# Patient Record
Sex: Female | Born: 2000 | Race: White | Hispanic: No | Marital: Single | Smoking: Never smoker
Health system: Southern US, Community
[De-identification: ages and names within clinical notes are randomized; demographics above are authoritative.]

## PROBLEM LIST (undated history)

## (undated) DIAGNOSIS — I10 Essential (primary) hypertension: Secondary | ICD-10-CM

## (undated) DIAGNOSIS — M303 Mucocutaneous lymph node syndrome [Kawasaki]: Secondary | ICD-10-CM

## (undated) DIAGNOSIS — R55 Syncope and collapse: Secondary | ICD-10-CM

## (undated) HISTORY — PX: TYMPANOSTOMY TUBE PLACEMENT: SHX32

## (undated) HISTORY — DX: Essential (primary) hypertension: I10

---
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
## 2019-09-28 ENCOUNTER — Other Ambulatory Visit: Payer: Self-pay

## 2019-09-28 ENCOUNTER — Emergency Department (HOSPITAL_COMMUNITY): Payer: Managed Care, Other (non HMO)

## 2019-09-28 ENCOUNTER — Encounter (HOSPITAL_COMMUNITY): Payer: Self-pay

## 2019-09-28 DIAGNOSIS — R42 Dizziness and giddiness: Secondary | ICD-10-CM | POA: Diagnosis not present

## 2019-09-28 DIAGNOSIS — R0602 Shortness of breath: Secondary | ICD-10-CM | POA: Diagnosis not present

## 2019-09-28 DIAGNOSIS — R0789 Other chest pain: Secondary | ICD-10-CM | POA: Diagnosis not present

## 2019-09-28 DIAGNOSIS — R002 Palpitations: Secondary | ICD-10-CM | POA: Insufficient documentation

## 2019-09-28 DIAGNOSIS — R110 Nausea: Secondary | ICD-10-CM | POA: Insufficient documentation

## 2019-09-28 DIAGNOSIS — E876 Hypokalemia: Secondary | ICD-10-CM | POA: Insufficient documentation

## 2019-09-28 DIAGNOSIS — R519 Headache, unspecified: Secondary | ICD-10-CM | POA: Diagnosis not present

## 2019-09-28 MED ORDER — SODIUM CHLORIDE 0.9% FLUSH: 3.0000 mL | Freq: Once | INTRAVENOUS | Status: DC

## 2019-09-28 NOTE — ED Triage Notes (Signed)
Pt arrived with complaints of chest pressure/pain that started this afternoon which radiates up her left neck. Pt states she is short of breath, dizzy, and nauseated.

## 2019-09-28 NOTE — ED Notes (Addendum)
Pt called for lab draw x1, no answer. [employer] ADDENDUM: Pt located in triage 1, labs currently being drawn for analysis. [employer]

## 2019-09-29 ENCOUNTER — Emergency Department (HOSPITAL_COMMUNITY)
Admission: EM | Admit: 2019-09-29 | Discharge: 2019-09-29 | Disposition: A | Discharge status: Home / Self Care | Payer: Managed Care, Other (non HMO) | Attending: Emergency Medicine | Admitting: Emergency Medicine

## 2019-09-29 DIAGNOSIS — R519 Headache, unspecified: Secondary | ICD-10-CM

## 2019-09-29 DIAGNOSIS — E876 Hypokalemia: Secondary | ICD-10-CM

## 2019-09-29 DIAGNOSIS — R002 Palpitations: Secondary | ICD-10-CM

## 2019-09-29 DIAGNOSIS — R0789 Other chest pain: Secondary | ICD-10-CM

## 2019-09-29 LAB — CBC
HCT: 40.3 % (ref 36.0–46.0)
Hemoglobin: 13.6 g/dL (ref 12.0–15.0)
MCH: 31.7 pg (ref 26.0–34.0)
MCHC: 33.7 g/dL (ref 30.0–36.0)
MCV: 93.9 fL (ref 80.0–100.0)
Platelets: 292 10*3/uL (ref 150–400)
RBC: 4.29 MIL/uL (ref 3.87–5.11)
RDW: 11.9 % (ref 11.5–15.5)
WBC: 7.6 10*3/uL (ref 4.0–10.5)
nRBC: 0 % (ref 0.0–0.2)

## 2019-09-29 LAB — I-STAT BETA HCG BLOOD, ED (NOT ORDERABLE): I-stat hCG, quantitative: <5 m[IU]/mL (ref <5)

## 2019-09-29 LAB — BASIC METABOLIC PANEL
Anion gap: 9 (ref 5–15)
BUN: 11 mg/dL (ref 6–20)
CO2: 24 mmol/L (ref 22–32)
Calcium: 9.1 mg/dL (ref 8.9–10.3)
Chloride: 107 mmol/L (ref 98–111)
Creatinine, Ser: 0.66 mg/dL (ref 0.44–1.00)
GFR calc Af Amer: >60 mL/min (ref >60)
GFR calc non Af Amer: >60 mL/min (ref >60)
Glucose, Bld: 99 mg/dL (ref 70–99)
Potassium: 3.2 mmol/L — ABNORMAL LOW (ref 3.5–5.1)
Sodium: 140 mmol/L (ref 135–145)

## 2019-09-29 LAB — TROPONIN I (HIGH SENSITIVITY)
Troponin I (High Sensitivity): <2 ng/L (ref <18)
Troponin I (High Sensitivity): <2 ng/L (ref <18)

## 2019-09-29 MED ORDER — POTASSIUM CHLORIDE CRYS ER 20 MEQ PO TBCR
40.0000 meq | EXTENDED_RELEASE_TABLET | Freq: Once | ORAL | Status: AC
  Administered 2019-09-29: 40 meq via ORAL
  Filled 2019-09-29: qty 2

## 2019-09-29 NOTE — Discharge Instructions (Signed)
Your work-up today was reassuring.  Your potassium was a little bit low so we will get you some orally to help bring your potassium levels back to normal.  Drink when you are get plenty of rest.  Continue to take your medications as prescribed.  Call your cardiologist in the morning to set up follow-up appointment for reevaluation of your symptoms.  Return to the emergency department if any concerning signs or symptoms develop such as worsening pain, shortness of breath, high fevers, loss of consciousness.

## 2019-09-29 NOTE — ED Provider Notes (Signed)
[facility]-EMERGENCY DEPT Provider Note   CSN: [id number] Arrival date & time: 09/28/19  2146     History   Chief Complaint Chief Complaint  Patient presents with   Chest Pain    HPI Jacqueline Kirby is a 18 y.o. female with history of Kawasaki disease at 18 years of age and vasovagal syncope presents today for evaluation of acute onset, intermittent chest pain.  She reports that 4 days ago while washing her face she developed a very sharp substernal chest pain radiating to the left side at that lasted for a few minutes before resolving entirely.  At noon yesterday morning (09/28/2019) while sitting at her desk she reports developing substernal chest pressure with associated nausea, shortness of breath, lightheadedness.  The pain will occasionally radiate to the left upper extremity and into the left side of the neck.  No aggravating or alleviating factors noted.  The pain is not exertional or pleuritic in nature.  Denies abdominal pain, syncope, fevers, chills, or cough.  Has not tried anything for her symptoms.  No family history of heart disease at a young age.  She denies cigarette smoking, recreational drug use, or excessive alcohol intake.  Presently she denies very mild chest pressure radiating into the left side of the neck.  She also reports developing mild generalized throbbing headache that is similar to headaches she has had in the past.  No vision changes.  [facility] due to her history of vasovagal syncope and takes Midodrine for this.  Chart review shows that she recently followed up with her cardiologist on 08/31/2019 and had her dose increased from 3 times daily to 4 times daily.     The history is provided by the patient.    Past Medical History:  Diagnosis Date   Kawasaki disease (HCC)    Vasovagal syncope     There are no active problems to display for this patient.   Past Surgical History:  Procedure Laterality Date    TYMPANOSTOMY TUBE PLACEMENT       OB History   No obstetric history on file.      Home Medications    Prior to Admission medications   Medication Sig Start Date End Date Taking? Authorizing Provider  midodrine (PROAMATINE) 2.5 MG tablet Take 2.5 mg by mouth 4 (four) times daily.  09/22/19  Yes [provider]  SRONYX 0.1-20 MG-MCG tablet Take 1 tablet by mouth daily. 09/09/19  Yes [provider]  cetirizine (ZYRTEC) 10 MG tablet Take 10 mg by mouth daily.     [provider]  ondansetron (ZOFRAN ODT) 4 MG disintegrating tablet 4mg  ODT q4 hours prn nausea/vomit Patient not taking: Reported on 09/29/2019 09/21/18   Charlynne PanderYao, David Hsienta, MD    Family History No family history on file.  Social History Social History   Tobacco Use   Smoking status: Never Smoker   Smokeless tobacco: Never Used  Substance Use Topics   Alcohol use: Never    Frequency: Never   Drug use: Never     Allergies   Patient has no known allergies.   Review of Systems Review of Systems  Constitutional: Negative for chills, diaphoresis and fever.  Eyes: Negative for photophobia and visual disturbance.  Respiratory: Positive for shortness of breath.   Cardiovascular: Positive for chest pain.  Gastrointestinal: Negative for abdominal pain, nausea and vomiting.  Neurological: Positive for light-headedness and headaches. Negative for syncope.  All other systems reviewed and  are negative.    Physical Exam Updated Vital Signs BP 104/65    Pulse 69    Temp 98.4 F (36.9 C) (Oral)    Resp 15    Ht 5\' 4"  (1.626 m)    Wt 58.1 kg    LMP 09/25/2019    SpO2 99%    BMI 21.97 kg/m   Physical Exam Vitals signs and nursing note reviewed.  Constitutional:      General: She is not in acute distress.    Appearance: She is well-developed.     Comments: Resting comfortably in bed  HENT:     Head: Normocephalic and atraumatic.  Eyes:     General:        Right eye: No discharge.          Left eye: No discharge.     Extraocular Movements: Extraocular movements intact.     Conjunctiva/sclera: Conjunctivae normal.     Pupils: Pupils are equal, round, and reactive to light.  Neck:     Musculoskeletal: Normal range of motion and neck supple.     Vascular: No JVD.     Trachea: No tracheal deviation.  Cardiovascular:     Rate and Rhythm: Normal rate and regular rhythm.     Heart sounds: Normal heart sounds.  Pulmonary:     Effort: Pulmonary effort is normal. No tachypnea.     Comments: Speaking in full sentences without difficulty. Chest:     Chest wall: No tenderness.  Abdominal:     General: There is no distension.     Palpations: Abdomen is soft. There is no mass.     Tenderness: There is no abdominal tenderness. There is no guarding or rebound.  Musculoskeletal:     Right lower leg: She exhibits no tenderness. No edema.     Left lower leg: She exhibits no tenderness. No edema.  Skin:    General: Skin is warm and dry.     Findings: No erythema.  Neurological:     General: No focal deficit present.     Mental Status: She is alert.     Cranial Nerves: No cranial nerve deficit.     Motor: No weakness.  Psychiatric:        Behavior: Behavior normal.      ED Treatments / Results  Labs (all labs ordered are listed, but only abnormal results are displayed) Labs Reviewed  BASIC METABOLIC PANEL - Abnormal; Notable for the following components:      Result Value   Potassium 3.2 (*)    All other components within normal limits  CBC  I-STAT BETA HCG BLOOD, ED ([facility], [facility], [facility] ONLY)  I-STAT BETA HCG BLOOD, ED (NOT ORDERABLE)  TROPONIN I (HIGH SENSITIVITY)  TROPONIN I (HIGH SENSITIVITY)    EKG EKG Interpretation  Date/Time:  Tuesday September 28 2019 22:49:42 EDT Ventricular Rate:  87 PR Interval:    QRS Duration: 85 QT Interval:  340 QTC Calculation: 409 R Axis:   76 Text Interpretation:  Sinus rhythm No old tracing to compare Confirmed by 06-08-2005  [phone]) on 09/29/2019 3:46:46 AM   Radiology Dg Chest 2 View  Result Date: 09/28/2019 CLINICAL DATA:  Shortness of breath and chest pain EXAM: CHEST - 2 VIEW COMPARISON:  None. FINDINGS: The heart size and mediastinal contours are within normal limits. Both lungs are clear. The visualized skeletal structures are unremarkable. IMPRESSION: No active cardiopulmonary disease. Electronically Signed   By: 11/28/2019.D.  On: 09/28/2019 23:07     Procedures Procedures (including critical care time)  Medications Ordered in ED Medications  potassium chloride SA (KLOR-CON) CR tablet 40 mEq (40 mEq Oral Given 09/29/19 0519)     Initial Impression / Assessment and Plan / ED Course  I have reviewed the triage vital signs and the nursing notes.  Pertinent labs & imaging results that were available during my care of the patient were reviewed by me and considered in my medical decision making (see chart for details).        Patient presents for evaluation of intermittent chest pain.  Also developed a very mild generalized headache while in the waiting room but reports that doxepin she is actually concerned about is not what brought her to the emergency department.  She has a normal neurologic hemorrhage, CVA, meningitis, or other concerning acute intracranial abnormality.  She is afebrile, vital signs are stable.  She is nontoxic in appearance.  Chest pain is not reproducible on palpation, pleuritic, or exertional.  On examination she exhibits regular rate and rhythm and EKG shows normal sinus rhythm with no ischemic abnormalities noted.  Serial troponins were obtained in the ED which were reassuring and not suggestive of ACS/MI.  Chest x-ray shows no acute cardiopulmonary abnormalities with no evidence of pneumonia or pleural effusion.  Remainder of lab work reviewed by me shows no leukocytosis, no anemia, no metabolic derangements.  No renal insufficiency.  She was mildly hypokalemic which was  replenished orally in the ED.  No concern for PE, cardiac tamponade, esophageal rupture, or pneumothorax.  Considered dissection however in the setting of normal blood pressure was normal neurologic examination I have a low suspicion of this.  She does have an outpatient cardiologist with whom she can follow up closely.  No further emergent work-up required in the ED.  On reevaluation the patient is resting comfortably in no apparent distress.  She is comfortable with discharge home.  She will call her cardiologist first thing in the morning to set up follow-up.  Discussed strict ED return precautions. Patient verbalized understanding of and agreement with plan and is safe for discharge home at this time.  Discussed with Dr. Leonette Monarch who agrees with assessment and plan at this time.  Final Clinical Impressions(s) / ED Diagnoses   Final diagnoses:  Atypical chest pain  Mild headache  Palpitations  Hypokalemia    ED Discharge Orders    None       Renita Papa, PA-C 10/01/19 1443    Fatima Blank, MD 10/02/19 [phone]

## 2019-12-13 IMAGING — CR DG CHEST 2V
2 series · 2 of 2 positions shown · non-contrast
Comparison: None.

CLINICAL DATA: Shortness of breath and chest pain

EXAM:
CHEST - 2 VIEW

[w chest pa]
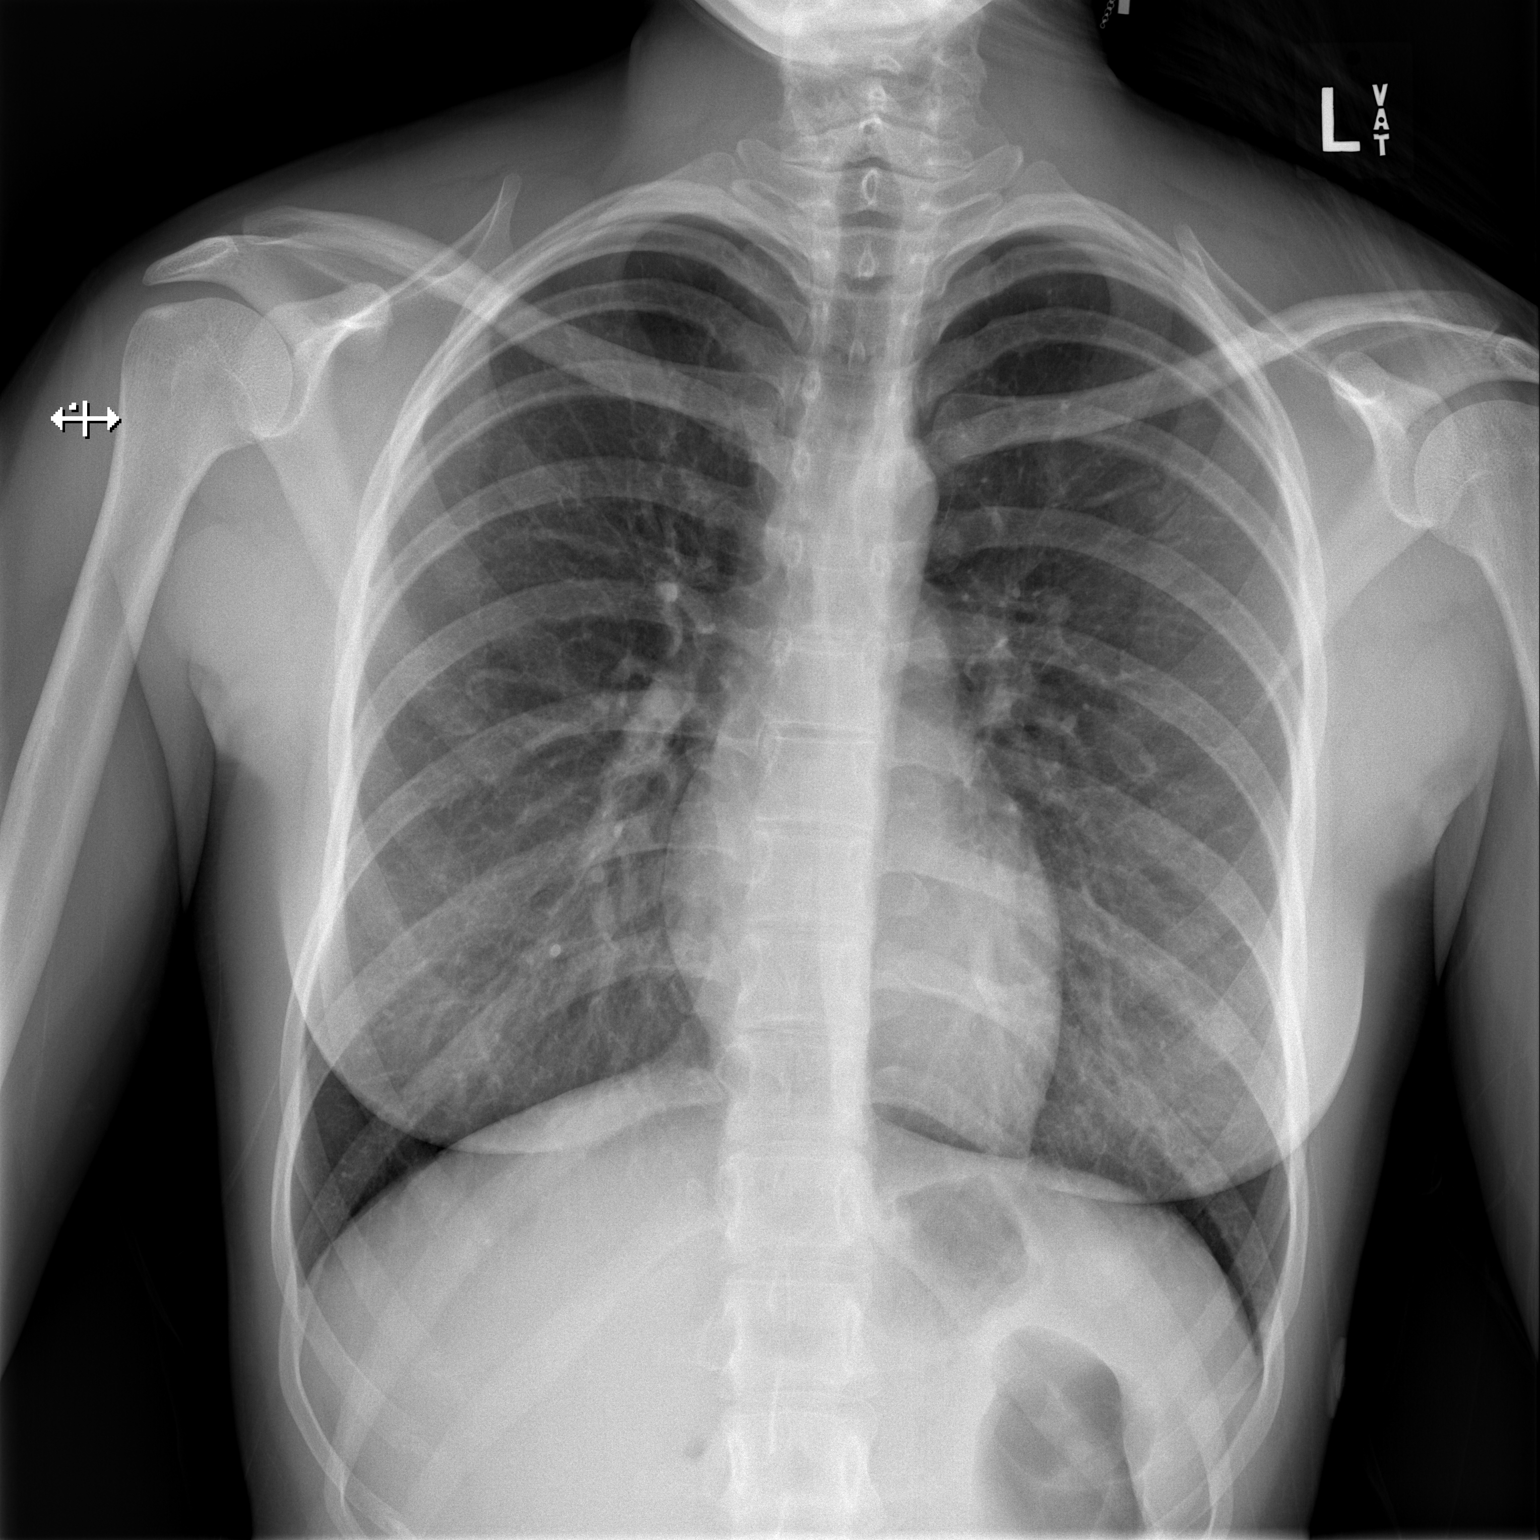

[w chest lat]
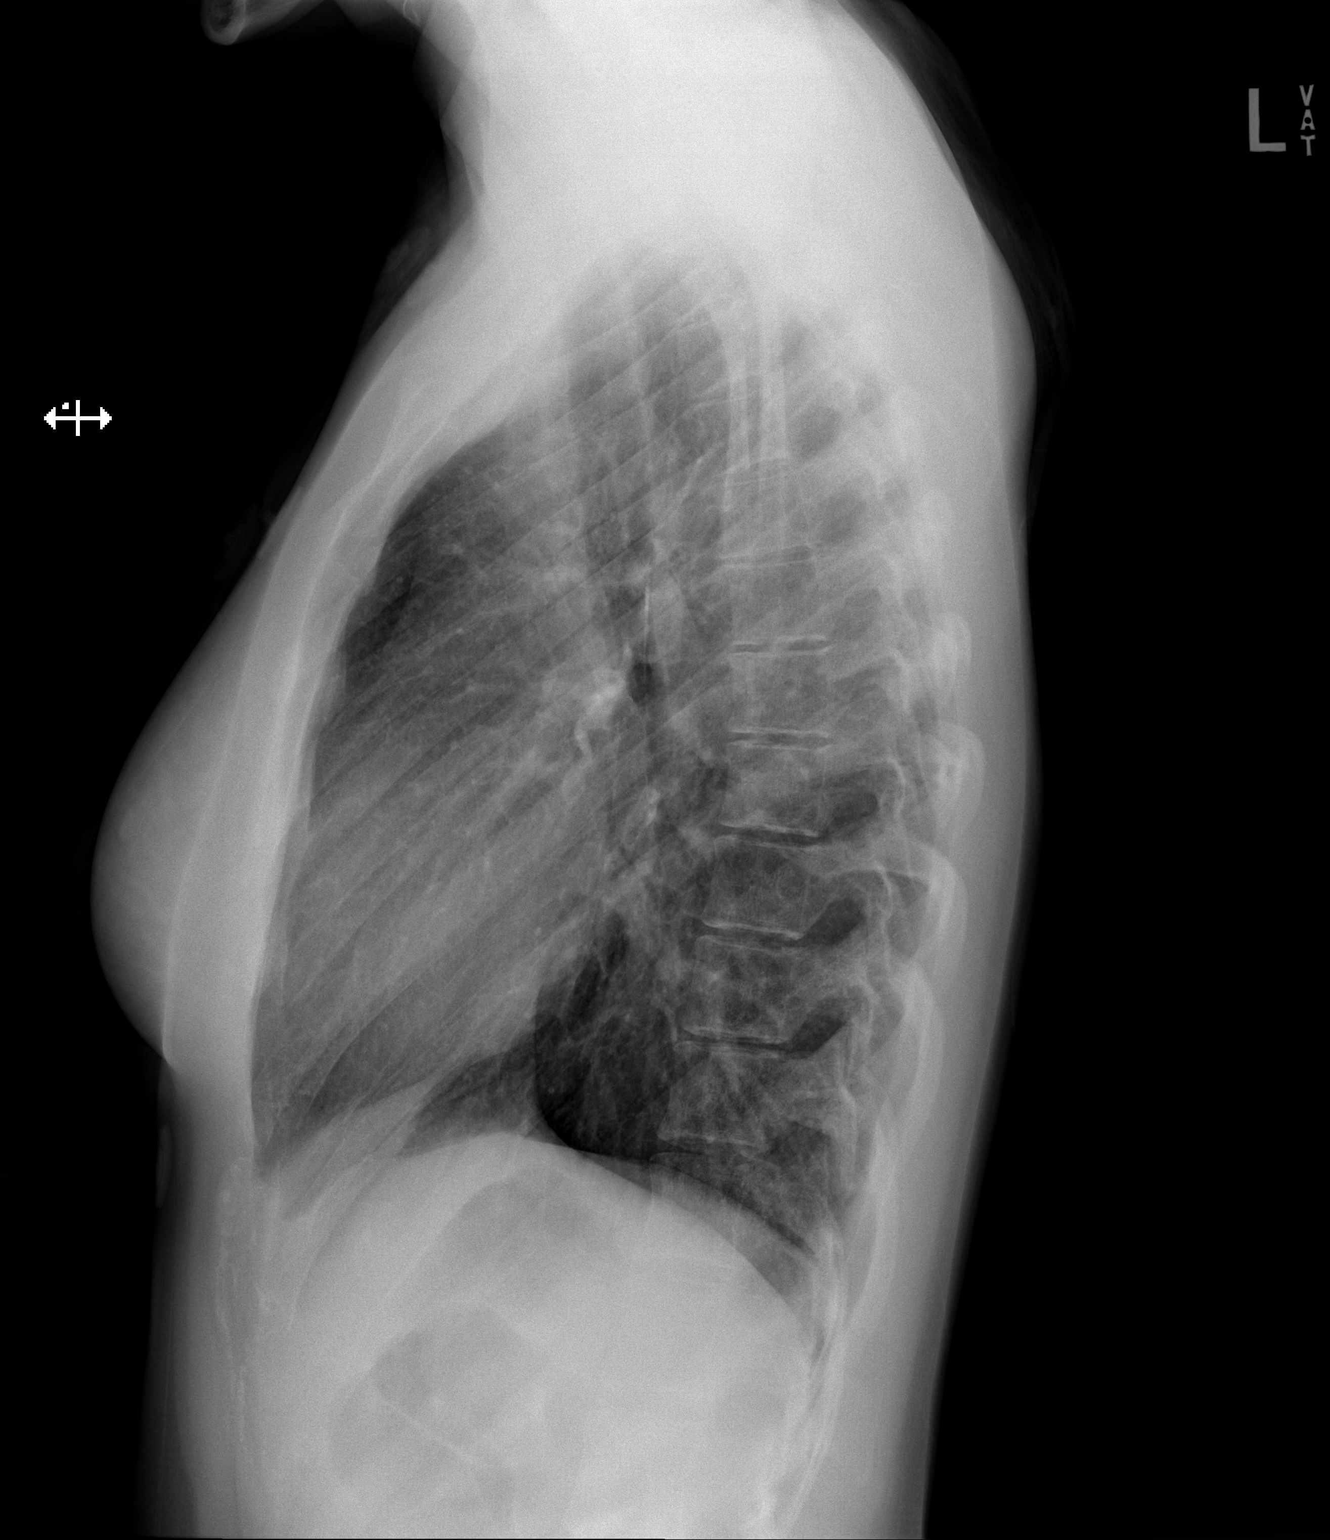

[2 of 2 positions shown; findings below may reference images not displayed]

FINDINGS: The heart size and mediastinal contours are within normal limits.
Both lungs are clear. The visualized skeletal structures are
unremarkable.
IMPRESSION: No active cardiopulmonary disease.

## 2021-07-30 ENCOUNTER — Encounter (HOSPITAL_COMMUNITY): Payer: Self-pay

## 2021-07-30 ENCOUNTER — Ambulatory Visit (HOSPITAL_COMMUNITY)
Admission: EM | Admit: 2021-07-30 | Discharge: 2021-07-30 | Disposition: A | Discharge status: Home / Self Care | Payer: Managed Care, Other (non HMO) | Attending: Family Medicine | Admitting: Family Medicine

## 2021-07-30 ENCOUNTER — Other Ambulatory Visit: Payer: Self-pay

## 2021-07-30 ENCOUNTER — Ambulatory Visit (HOSPITAL_COMMUNITY): Payer: Self-pay

## 2021-07-30 DIAGNOSIS — S0500XA Injury of conjunctiva and corneal abrasion without foreign body, unspecified eye, initial encounter: Secondary | ICD-10-CM | POA: Diagnosis not present

## 2021-07-30 MED ORDER — ERYTHROMYCIN 5 MG/GM OP OINT: TOPICAL_OINTMENT | OPHTHALMIC | 0 refills | Status: DC

## 2021-07-30 NOTE — ED Notes (Signed)
Pt not answered.  
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
## 2021-07-30 NOTE — ED Triage Notes (Signed)
Pt presents with c/o sand in her eye x 1 week.   States she has not had blurred vision.

## 2021-12-25 ENCOUNTER — Other Ambulatory Visit: Payer: Self-pay

## 2021-12-25 ENCOUNTER — Ambulatory Visit (HOSPITAL_COMMUNITY)
Admission: RE | Admit: 2021-12-25 | Discharge: 2021-12-25 | Disposition: A | Discharge status: Home / Self Care | Payer: Managed Care, Other (non HMO) | Source: Ambulatory Visit | Attending: Student | Admitting: Student

## 2021-12-25 ENCOUNTER — Encounter (HOSPITAL_COMMUNITY): Payer: Self-pay

## 2021-12-25 VITALS — BP 114/78 | HR 110 | Temp 98.5°F | Resp 20

## 2021-12-25 DIAGNOSIS — J039 Acute tonsillitis, unspecified: Secondary | ICD-10-CM | POA: Diagnosis present

## 2021-12-25 DIAGNOSIS — H66002 Acute suppurative otitis media without spontaneous rupture of ear drum, left ear: Secondary | ICD-10-CM | POA: Diagnosis present

## 2021-12-25 DIAGNOSIS — Z112 Encounter for screening for other bacterial diseases: Secondary | ICD-10-CM

## 2021-12-25 LAB — POCT RAPID STREP A, ED / UC: Streptococcus, Group A Screen (Direct): NEGATIVE

## 2021-12-25 MED ORDER — LIDOCAINE VISCOUS HCL 2 % MT SOLN: 15.0000 mL | OROMUCOSAL | 0 refills | Status: AC | PRN

## 2021-12-25 MED ORDER — AMOXICILLIN-POT CLAVULANATE 875-125 MG PO TABS: 1.0000 | ORAL_TABLET | Freq: Two times a day (BID) | ORAL | 0 refills | Status: AC

## 2021-12-25 NOTE — ED Triage Notes (Signed)
Pt presents with sore throat X 2 days that causes ear pain and vomiting since last night.

## 2021-12-25 NOTE — Discharge Instructions (Addendum)
-  Start the antibiotic-Augmentin (amoxicillin-clavulanate), 1 pill every 12 hours for 7 days.  You can take this with food like with breakfast and dinner. -For sore throat, use lidocaine mouthwash up to every 4 hours. Make sure not to eat for at least 1 hour after using this, as your mouth will be very numb and you could bite yourself. -With a virus, you're typically contagious for 5-7 days, or as long as you're having fevers.  -You can take Tylenol up to 1000 mg 3 times daily, and ibuprofen up to 600 mg 3 times daily with food.  You can take these together, or alternate every 3-4 hours. -Follow-up if symptoms worsen/persist

## 2021-12-25 NOTE — ED Provider Notes (Signed)
[facility]    CSN: [id number] Arrival date & time: 12/25/21  1850      History   Chief Complaint Chief Complaint  Patient presents with   APPOINTMENT: Sore Throat, Vomiting, Ear Pain    HPI Jacqueline Kirby is a 21 y.o. female presenting with sore throat. Medical history exercise induced asthma, uses albuterol inhaler prn; recurrent AOM. Sore throat x2 days, pain radiating to L ear without hearing changes, dizziness, tinnitus. Nausea intermittently with two episodes of vomiting, tolerating fluids. Denies diarrhea. Fevers 1 day ago 102.5. last tylenol 24 hours ago. Some myalgias, relieved by tylenol. Denies cough, SOB, CP, sensation of throat closing, difficulty tolerating secretions. Denies known sick contacts. Nuvaring contraception.  HPI  Past Medical History:  Diagnosis Date   Kawasaki disease (HCC)    Vasovagal syncope     There are no problems to display for this patient.   Past Surgical History:  Procedure Laterality Date   TYMPANOSTOMY TUBE PLACEMENT      OB History   No obstetric history on file.      Home Medications    Prior to Admission medications   Medication Sig Start Date End Date Taking? Authorizing Provider  amoxicillin-clavulanate (AUGMENTIN) 875-125 MG tablet Take 1 tablet by mouth every 12 (twelve) hours. 12/25/21  Yes Rhys Martini, PA-C  lidocaine (XYLOCAINE) 2 % solution Use as directed 15 mLs in the mouth or throat as needed for mouth pain. 12/25/21  Yes Rhys Martini, PA-C  cetirizine (ZYRTEC) 10 MG tablet Take 10 mg by mouth daily.     [provider]  midodrine (PROAMATINE) 2.5 MG tablet Take 2.5 mg by mouth 4 (four) times daily.  09/22/19   [provider]  ondansetron (ZOFRAN ODT) 4 MG disintegrating tablet 4mg  ODT q4 hours prn nausea/vomit Patient not taking: Reported on 09/29/2019 09/21/18   09/23/18, MD  [facility] 0.1-20 MG-MCG tablet Take 1 tablet by mouth daily. 09/09/19   [provider]     Family History Family History  Family history unknown: Yes    Social History Social History   Tobacco Use   Smoking status: Never   Smokeless tobacco: Never  Vaping Use   Vaping Use: Never used  Substance Use Topics   Alcohol use: Never   Drug use: Never     Allergies   Patient has no known allergies.   Review of Systems Review of Systems  Constitutional:  Negative for appetite change, chills and fever.  HENT:  Positive for ear pain and sore throat. Negative for congestion, rhinorrhea, sinus pressure and sinus pain.   Eyes:  Negative for redness and visual disturbance.  Respiratory:  Negative for cough, chest tightness, shortness of breath and wheezing.   Cardiovascular:  Negative for chest pain and palpitations.  Gastrointestinal:  Positive for nausea and vomiting. Negative for abdominal pain, constipation and diarrhea.  Genitourinary:  Negative for dysuria, frequency and urgency.  Musculoskeletal:  Negative for myalgias.  Neurological:  Negative for dizziness, weakness and headaches.  Psychiatric/Behavioral:  Negative for confusion.   All other systems reviewed and are negative.   Physical Exam Triage Vital Signs ED Triage Vitals  Enc Vitals Group     BP 12/25/21 1915 114/78     Pulse Rate 12/25/21 1915 (!) 110     Resp 12/25/21 1915 20     Temp 12/25/21 1915 98.5 F (36.9 C)     Temp Source 12/25/21 1915 Oral     SpO2  12/25/21 1915 98 %     Weight --      Height --      Head Circumference --      Peak Flow --      Pain Score 12/25/21 1916 7     Pain Loc --      Pain Edu? --      Excl. in GC? --    No data found.  Updated Vital Signs BP 114/78 (BP Location: Left Arm)    Pulse (!) 110    Temp 98.5 F (36.9 C) (Oral)    Resp 20    LMP 12/17/2021    SpO2 98%   Visual Acuity Right Eye Distance:   Left Eye Distance:   Bilateral Distance:    Right Eye Near:   Left Eye Near:    Bilateral Near:     Physical Exam Vitals reviewed.   Constitutional:      General: She is not in acute distress.    Appearance: Normal appearance. She is not ill-appearing.  HENT:     Head: Normocephalic and atraumatic.     Right Ear: Tympanic membrane, ear canal and external ear normal. No tenderness. No middle ear effusion. There is no impacted cerumen. Tympanic membrane is not perforated, erythematous, retracted or bulging.     Left Ear: Ear canal and external ear normal. No tenderness.  No middle ear effusion. There is no impacted cerumen. Tympanic membrane is erythematous. Tympanic membrane is not perforated, retracted or bulging.     Nose: Nose normal. No congestion.     Mouth/Throat:     Mouth: Mucous membranes are moist.     Pharynx: Uvula midline. Posterior oropharyngeal erythema present. No oropharyngeal exudate.     Tonsils: Tonsillar exudate present. 2+ on the right. 2+ on the left.     Comments: Tonsils 2+ bilaterally with exudate Eyes:     Extraocular Movements: Extraocular movements intact.     Pupils: Pupils are equal, round, and reactive to light.  Cardiovascular:     Rate and Rhythm: Normal rate and regular rhythm.     Heart sounds: Normal heart sounds.  Pulmonary:     Effort: Pulmonary effort is normal.     Breath sounds: Normal breath sounds. No decreased breath sounds, wheezing, rhonchi or rales.  Abdominal:     Palpations: Abdomen is soft.     Tenderness: There is no abdominal tenderness. There is no guarding or rebound.  Lymphadenopathy:     Cervical: Cervical adenopathy present.     Right cervical: No superficial, deep or posterior cervical adenopathy.    Left cervical: Superficial cervical adenopathy present. No deep or posterior cervical adenopathy.  Neurological:     General: No focal deficit present.     Mental Status: She is alert and oriented to person, place, and time.  Psychiatric:        Mood and Affect: Mood normal.        Behavior: Behavior normal.        Thought Content: Thought content normal.         Judgment: Judgment normal.     UC Treatments / Results  Labs (all labs ordered are listed, but only abnormal results are displayed) Labs Reviewed  CULTURE, GROUP A STREP [facility])  POCT RAPID STREP A, ED / UC    EKG   Radiology No results found.  Procedures Procedures (including critical care time)  Medications Ordered in UC Medications - No data to display  Initial  Impression / Assessment and Plan / UC Course  I have reviewed the triage vital signs and the nursing notes.  Pertinent labs & imaging results that were available during my care of the patient were reviewed by me and considered in my medical decision making (see chart for details).     This patient is a very pleasant 21 y.o. year old female presenting with tonsillitis and L AOM. Tachy at 110, afebrile. LMP 1 week ago, Nuvaring contraception, States she is not pregnant or breastfeeding.  Rapid strep negative, culture sent .  Will manage with augmentin to cover for AOM and exudative tonsillitis.   ED return precautions discussed. Patient verbalizes understanding and agreement.   Coding Level 4 for acute illness with systemic symptoms, and prescription drug management  Final Clinical Impressions(s) / UC Diagnoses   Final diagnoses:  Non-recurrent acute suppurative otitis media of left ear without spontaneous rupture of tympanic membrane  Screening for streptococcal infection     Discharge Instructions      -Start the antibiotic-Augmentin (amoxicillin-clavulanate), 1 pill every 12 hours for 7 days.  You can take this with food like with breakfast and dinner. -For sore throat, use lidocaine mouthwash up to every 4 hours. Make sure not to eat for at least 1 hour after using this, as your mouth will be very numb and you could bite yourself. -With a virus, you're typically contagious for 5-7 days, or as long as you're having fevers.  -You can take Tylenol up to 1000 mg 3 times daily, and ibuprofen up to  600 mg 3 times daily with food.  You can take these together, or alternate every 3-4 hours. -Follow-up if symptoms worsen/persist     ED Prescriptions     Medication Sig Dispense Auth. Provider   amoxicillin-clavulanate (AUGMENTIN) 875-125 MG tablet Take 1 tablet by mouth every 12 (twelve) hours. 14 tablet Ignacia BayleyGraham, Koren Sermersheim E, PA-C   lidocaine (XYLOCAINE) 2 % solution Use as directed 15 mLs in the mouth or throat as needed for mouth pain. 100 mL Rhys MartiniGraham, Lorry Anastasi E, PA-C      PDMP not reviewed this encounter.   Rhys MartiniGraham, Hilmar Moldovan E, PA-C 12/25/21 1941

## 2021-12-27 LAB — CULTURE, GROUP A STREP (THRC)

## 2022-02-26 ENCOUNTER — Ambulatory Visit: Payer: Managed Care, Other (non HMO) | Admitting: Obstetrics and Gynecology

## 2022-05-08 ENCOUNTER — Ambulatory Visit: Payer: Managed Care, Other (non HMO) | Admitting: Obstetrics and Gynecology

## 2022-05-10 ENCOUNTER — Ambulatory Visit (INDEPENDENT_AMBULATORY_CARE_PROVIDER_SITE_OTHER): Payer: Managed Care, Other (non HMO) | Admitting: Obstetrics and Gynecology

## 2022-05-10 ENCOUNTER — Encounter: Payer: Self-pay | Admitting: Obstetrics and Gynecology

## 2022-05-10 VITALS — BP 113/74 | HR 83 | Ht 64.0 in | Wt 123.0 lb

## 2022-05-10 DIAGNOSIS — R350 Frequency of micturition: Secondary | ICD-10-CM

## 2022-05-10 DIAGNOSIS — R39198 Other difficulties with micturition: Secondary | ICD-10-CM | POA: Diagnosis not present

## 2022-05-10 LAB — POCT URINALYSIS DIPSTICK
Appearance: NORMAL
Bilirubin, UA: NEGATIVE
Blood, UA: NEGATIVE
Glucose, UA: NEGATIVE
Ketones, UA: NEGATIVE
Leukocytes, UA: NEGATIVE
Nitrite, UA: NEGATIVE
Protein, UA: NEGATIVE
Spec Grav, UA: 1.025 (ref 1.010–1.025)
Urobilinogen, UA: 0.2 E.U./dL
pH, UA: 7 (ref 5.0–8.0)
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
## 2022-05-23 ENCOUNTER — Ambulatory Visit: Payer: Managed Care, Other (non HMO) | Admitting: Obstetrics and Gynecology

## 2022-08-16 ENCOUNTER — Encounter (HOSPITAL_BASED_OUTPATIENT_CLINIC_OR_DEPARTMENT_OTHER): Payer: Self-pay

## 2022-08-16 DIAGNOSIS — R5383 Other fatigue: Secondary | ICD-10-CM

## 2022-08-16 DIAGNOSIS — G4710 Hypersomnia, unspecified: Secondary | ICD-10-CM

## 2022-09-20 ENCOUNTER — Ambulatory Visit (HOSPITAL_BASED_OUTPATIENT_CLINIC_OR_DEPARTMENT_OTHER): Payer: 59 | Attending: Internal Medicine | Admitting: Internal Medicine

## 2022-09-20 VITALS — Ht 64.0 in | Wt 125.0 lb

## 2022-09-20 DIAGNOSIS — G4710 Hypersomnia, unspecified: Secondary | ICD-10-CM | POA: Diagnosis present

## 2022-09-20 DIAGNOSIS — R5383 Other fatigue: Secondary | ICD-10-CM | POA: Insufficient documentation

## 2022-09-29 DIAGNOSIS — G4710 Hypersomnia, unspecified: Secondary | ICD-10-CM

## 2022-09-29 NOTE — Procedures (Signed)
     Patient Name: Jacqueline Kirby, Jacqueline Kirby Date: 09/20/2022 Gender: Female D.O.B: 24-Mar-2001 Age (years): 21 Referring Provider: Graciela Husbands NP Height (inches): 64 Interpreting Physician: Baird Lyons MD, ABSM Weight (lbs): 125 RPSGT: Earney [place] BMI: 21 MRN: [id number] Neck Size: 13.00  CLINICAL INFORMATION Sleep Study Type: NPSG Indication for sleep study: Excessive somnolence, sleep walking Epworth Sleepiness Score: 20  SLEEP STUDY TECHNIQUE As per the AASM Manual for the Scoring of Sleep and Associated Events v2.3 (April 2016) with a hypopnea requiring 4% desaturations.  The channels recorded and monitored were frontal, central and occipital EEG, electrooculogram (EOG), submentalis EMG (chin), nasal and oral airflow, thoracic and abdominal wall motion, anterior tibialis EMG, snore microphone, electrocardiogram, and pulse oximetry.  MEDICATIONS Medications self-administered by patient taken the night of the study : SERTRALINE, MAGNESIUM CARBONATE, ED-B12  SLEEP ARCHITECTURE The study was initiated at 9:51:44 PM and ended at 4:26:55 AM.  Sleep onset time was 24.6 minutes and the sleep efficiency was 81.7%%. The total sleep time was 323 minutes.  Stage REM latency was 179.5 minutes.  The patient spent 1.4%% of the night in stage N1 sleep, 61.9%% in stage N2 sleep, 12.1%% in stage N3 and 24.6% in REM.  Alpha intrusion was absent.  Supine sleep was 45.89%.  RESPIRATORY PARAMETERS The overall apnea/hypopnea index (AHI) was 0.2 per hour. There were 1 total apneas, including 0 obstructive, 1 central and 0 mixed apneas. There were 0 hypopneas and 4 RERAs.  The AHI during Stage REM sleep was 0.8 per hour.  AHI while supine was 0.4 per hour.  The mean oxygen saturation was 97.4%. The minimum SpO2 during sleep was 91.0%.  soft snoring was noted during this study.  CARDIAC DATA The 2 lead EKG demonstrated sinus rhythm. The mean heart rate was 67.3 beats per minute.  Other EKG findings include: None.  LEG MOVEMENT DATA The total PLMS were 0 with a resulting PLMS index of 0.0. Associated arousal with leg movement index was 0.0 .  IMPRESSIONS - No significant obstructive sleep apnea occurred during this study (AHI = 0.2/h). - No significant central sleep apnea occurred during this study (CAI = 0.2/h). - The patient had minimal or no oxygen desaturation during the study (Min O2 = 91.0%) - The patient snored with soft snoring volume. - No cardiac abnormalities were noted during this study. - Clinically significant periodic limb movements did not occur during sleep. No significant associated arousals. - Unremarkable sleep architecture.  DIAGNOSIS - Normal Study  RECOMMENDATIONS - Be careful with alcohol, sedatives and other CNS depressants that may worsen sleep apnea and disrupt normal sleep architecture. - Sleep hygiene should be reviewed to assess factors that may improve sleep quality. - Weight management and regular exercise should be initiated or continued if appropriate.  [Electronically signed] 09/29/2022 12:29 PM  Baird Lyons MD, [place], American Board of Sleep Medicine NPI: [id number]                        [place] [place] [facility]  ELECTRONICALLY SIGNED ON:  09/29/2022, 12:27 PM [place] PH: [phone]   FX: [phone] [place]

## 2022-10-14 ENCOUNTER — Encounter: Payer: Self-pay | Admitting: *Deleted

## 2024-02-01 ENCOUNTER — Encounter (HOSPITAL_COMMUNITY): Payer: Self-pay | Admitting: *Deleted

## 2024-02-01 ENCOUNTER — Other Ambulatory Visit: Payer: Self-pay

## 2024-02-01 ENCOUNTER — Emergency Department (HOSPITAL_COMMUNITY)
Admission: EM | Admit: 2024-02-01 | Discharge: 2024-02-02 | Disposition: A | Discharge status: Home / Self Care | Payer: Worker's Compensation | Attending: Emergency Medicine | Admitting: Emergency Medicine

## 2024-02-01 DIAGNOSIS — Z7721 Contact with and (suspected) exposure to potentially hazardous body fluids: Secondary | ICD-10-CM | POA: Insufficient documentation

## 2024-02-01 DIAGNOSIS — Y990 Civilian activity done for income or pay: Secondary | ICD-10-CM | POA: Insufficient documentation

## 2024-02-01 NOTE — ED Provider Notes (Signed)
  [facility]PT [facility] Emergency Department Provider Note MRN:  [id number]  Arrival date & time: 02/02/24     Chief Complaint   Body Fluid Exposure   History of Present Illness   Jacqueline Kirby is a 23 y.o. year-old female presents to the ED with chief complaint of body fluid exposure.  Patient works for [employer].  States that she was with a patient and got blood (from spit) in her left eye.  She flushed it with 3 flushes.  She denies any other complaints.  History provided by patient.   Review of Systems  Pertinent positive and negative review of systems noted in HPI.    Physical Exam   Vitals:   02/01/24 2323  BP: 121/77  Pulse: 83  Resp: 17  Temp: (!) 97.1 F (36.2 C)  SpO2: 100%    CONSTITUTIONAL:  non toxic-appearing, NAD NEURO:  Alert and oriented x 3, CN 3-12 grossly intact EYES:  eyes equal and reactive ENT/NECK:  Supple, no stridor  CARDIO:  normal rate, appears well-perfused  PULM:  No respiratory distress,  GI/GU:  non-distended,  MSK/SPINE:  No gross deformities, no edema, moves all extremities  SKIN:  no rash, atraumatic   *Additional and/or pertinent findings included in MDM below  Diagnostic and Interventional Summary    EKG Interpretation Date/Time:    Ventricular Rate:    PR Interval:    QRS Duration:    QT Interval:    QTC Calculation:   R Axis:      Text Interpretation:         Labs Reviewed - No data to display  No orders to display    Medications - No data to display   Procedures  /  Critical Care Procedures  ED Course and Medical Decision Making  I have reviewed the triage vital signs, the nursing notes, and pertinent available records from the EMR.  Social Determinants Affecting Complexity of Care: Patient has no clinically significant social determinants affecting this chief complaint..   ED Course:    Medical Decision Making Low risk exposures per CDC criteria, not warranting prophylactic treatment.  EMS  did bring the source patient to the emergency department for blood work, told me that EMS would cover his charges.  Blood work was obtained from the source patient.  I will notify the exposed patient if source patient is positive.         Consultants: No consultations were needed in caring for this patient.   Treatment and Plan: Emergency department workup does not suggest an emergent condition requiring admission or immediate intervention beyond  what has been performed at this time. The patient is safe for discharge and has  been instructed to return immediately for worsening symptoms, change in  symptoms or any other concerns    Final Clinical Impressions(s) / ED Diagnoses     ICD-10-CM   1. Exposure to blood  Z77.21       ED Discharge Orders     None         Discharge Instructions Discussed with and Provided to Patient:     Discharge Instructions      We will notify you if the results are positive.       Vicky Charleston, PA-C 02/02/24 [id number]    Nicholaus Cassondra DEL, MD 02/02/24 [phone]

## 2024-02-01 NOTE — Discharge Instructions (Addendum)
 We will notify you if the results are positive.

## 2024-02-01 NOTE — ED Triage Notes (Signed)
 The paramedic on a gems truck went to the house of a pt with a nose bleed and when the pt was bleeding into a bag the paramedic had blood splatter into her lt eye  they were sent here to get tested for an exposure
